# Patient Record
Sex: Male | Born: 1980 | Race: Black or African American | Hispanic: No | Marital: Single | Smoking: Current every day smoker
Health system: Southern US, Community
[De-identification: ages and names within clinical notes are randomized; demographics above are authoritative.]

---

## 2016-12-29 ENCOUNTER — Encounter: Payer: Self-pay | Admitting: Emergency Medicine

## 2016-12-29 ENCOUNTER — Emergency Department
Admission: EM | Admit: 2016-12-29 | Discharge: 2016-12-29 | Disposition: A | Discharge status: Home / Self Care | Payer: Self-pay | Attending: Emergency Medicine | Admitting: Emergency Medicine

## 2016-12-29 DIAGNOSIS — R030 Elevated blood-pressure reading, without diagnosis of hypertension: Secondary | ICD-10-CM

## 2016-12-29 DIAGNOSIS — J029 Acute pharyngitis, unspecified: Secondary | ICD-10-CM | POA: Insufficient documentation

## 2016-12-29 LAB — BASIC METABOLIC PANEL
ANION GAP: 10 (ref 5–15)
BUN: 8 mg/dL (ref 6–20)
CO2: 26 mmol/L (ref 22–32)
CREATININE: 0.97 mg/dL (ref 0.61–1.24)
Calcium: 9.1 mg/dL (ref 8.9–10.3)
Chloride: 104 mmol/L (ref 101–111)
GLUCOSE: 96 mg/dL (ref 65–99)
Potassium: 3.8 mmol/L (ref 3.5–5.1)
Sodium: 140 mmol/L (ref 135–145)

## 2016-12-29 LAB — CBC WITH DIFFERENTIAL/PLATELET
BASOS ABS: 0.1 10*3/uL (ref 0–0.1)
BASOS PCT: 1 %
EOS PCT: 1 %
Eosinophils Absolute: 0.1 10*3/uL (ref 0–0.7)
HCT: 39.7 % — ABNORMAL LOW (ref 40.0–52.0)
Hemoglobin: 13 g/dL (ref 13.0–18.0)
Lymphocytes Relative: 20 %
Lymphs Abs: 1.4 10*3/uL (ref 1.0–3.6)
MCH: 28.7 pg (ref 26.0–34.0)
MCHC: 32.8 g/dL (ref 32.0–36.0)
MCV: 87.3 fL (ref 80.0–100.0)
MONO ABS: 0.7 10*3/uL (ref 0.2–1.0)
Monocytes Relative: 10 %
NEUTROS ABS: 4.5 10*3/uL (ref 1.4–6.5)
Neutrophils Relative %: 68 %
PLATELETS: 213 10*3/uL (ref 150–440)
RBC: 4.55 MIL/uL (ref 4.40–5.90)
RDW: 13 % (ref 11.5–14.5)
WBC: 6.7 10*3/uL (ref 3.8–10.6)

## 2016-12-29 LAB — POCT RAPID STREP A: Streptococcus, Group A Screen (Direct): NEGATIVE

## 2016-12-29 MED ORDER — PREDNISONE 10 MG PO TABS: ORAL_TABLET | ORAL | 0 refills | Status: DC

## 2016-12-29 MED ORDER — HYDROCODONE-ACETAMINOPHEN 5-325 MG PO TABS
1.0000 | ORAL_TABLET | Freq: Once | ORAL | Status: AC
  Administered 2016-12-29: 1 via ORAL
  Filled 2016-12-29: qty 1

## 2016-12-29 MED ORDER — SODIUM CHLORIDE 0.9 % IV SOLN
3.0000 g | Freq: Once | INTRAVENOUS | Status: AC
  Administered 2016-12-29: 3 g via INTRAVENOUS
  Filled 2016-12-29: qty 3

## 2016-12-29 MED ORDER — AMOXICILLIN-POT CLAVULANATE 875-125 MG PO TABS: 1.0000 | ORAL_TABLET | Freq: Two times a day (BID) | ORAL | 0 refills | Status: AC

## 2016-12-29 MED ORDER — DEXAMETHASONE SODIUM PHOSPHATE 10 MG/ML IJ SOLN
10.0000 mg | Freq: Once | INTRAMUSCULAR | Status: AC
Start: 2016-12-29 — End: 2016-12-29
  Administered 2016-12-29: 10 mg via INTRAVENOUS
  Filled 2016-12-29: qty 1

## 2016-12-29 MED ORDER — HYDROCODONE-ACETAMINOPHEN 5-325 MG PO TABS: 1.0000 | ORAL_TABLET | Freq: Four times a day (QID) | ORAL | 0 refills | Status: AC | PRN

## 2016-12-29 NOTE — ED Provider Notes (Signed)
[facility] Emergency Department Provider Note   ____________________________________________   First MD Initiated Contact with Patient 12/29/16 [phone]     (approximate)  I have reviewed the triage vital signs and the nursing notes.   HISTORY  Chief Complaint Sore Throat and Otalgia   HPI Douglas Delorse LimberLegette Jr. is a 36 y.o. male is here with complaint of right ear pain for 3 days and this morning throat pain. Patient states that the right side is throat feels like it is swollen. He is unaware of any fever or chills.He has been continuing to eat and drink as normal. He states that pain increases with swallowing. He denies difficulty swallowing secretions.currently he rates his pain as 10 over 10.  History reviewed. No pertinent past medical history.  There are no active problems to display for this patient.   History reviewed. No pertinent surgical history.  Prior to Admission medications   Medication Sig Start Date End Date Taking? Authorizing Provider  amoxicillin-clavulanate (AUGMENTIN) 875-125 MG tablet Take 1 tablet by mouth 2 (two) times daily. 12/29/16 01/05/17  Douglas Crane, Douglas Duley Crane, Douglas Crane  HYDROcodone-acetaminophen (NORCO/VICODIN) 5-325 MG tablet Take 1 tablet by mouth every 6 (six) hours as needed for moderate pain. 12/29/16   Douglas Crane, Douglas Nau Crane, Douglas Crane  predniSONE (DELTASONE) 10 MG tablet Take 6 tablets  today, on day 2 take 5 tablets, day 3 take 4 tablets, day 4 take 3 tablets, day 5 take  2 tablets and 1 tablet the last day 12/29/16   Douglas Crane, Douglas Voytko Crane, Douglas Crane    Allergies Patient has no known allergies.  No family history on file.  Social History Social History  Substance Use Topics  . Smoking status: Not on file  . Smokeless tobacco: Not on file  . Alcohol use Not on file    Review of Systems Constitutional: No fever/chills Eyes: No visual changes. ENT: positive for sore throat. Cardiovascular: Denies chest pain. Respiratory: Denies  shortness of breath. Gastrointestinal:   No nausea, no vomiting.   Skin: Negative for rash. Neurological: Negative for headaches, focal weakness or numbness. ___________________________________________   PHYSICAL EXAM:  VITAL SIGNS: ED Triage Vitals [12/29/16 0840]  Enc Vitals Group     BP (!) 157/110     Pulse Rate 84     Resp 18     Temp 98.7 F (37.1 C)     Temp Source Oral     SpO2 99 %     Weight 180 lb (81.6 kg)     Height 6' (1.829 m)     Head Circumference      Peak Flow      Pain Score 10     Pain Loc      Pain Edu?      Excl. in GC?    Constitutional: Alert and oriented. Well appearing and in no acute distress. Eyes: Conjunctivae are normal.  Head: Atraumatic. Nose: No congestion/rhinnorhea.  EACs are obstructed with cerumen bilaterally. Mouth/Throat: Mucous membranes are moist.  Oropharynx erythematous without exudate. Right tonsil is mildly edematous but uvula is midline and patient continues to maintain secretions without any difficulty. Neck: No stridor.   Hematological/Lymphatic/Immunilogical: positive for right cervical lymphadenopathy. Cardiovascular: Normal rate, regular rhythm. Grossly normal heart sounds.  Good peripheral circulation. Respiratory: Normal respiratory effort.  No retractions. Lungs CTAB. Musculoskeletal: moves upper and lower extremities without any difficulty and normal gait was noted. Neurologic:  Normal speech and language. No gross focal neurologic deficits are appreciated.  Skin:  Skin  is warm, dry and intact. No rash noted. Psychiatric: Mood and affect are normal. Speech and behavior are normal.  ____________________________________________   LABS (all labs ordered are listed, but only abnormal results are displayed)  Labs Reviewed  CBC WITH DIFFERENTIAL/PLATELET - Abnormal; Notable for the following:       Result Value   HCT 39.7 (*)    All other components within normal limits  CULTURE, GROUP A STREP [facility])  BASIC  METABOLIC PANEL  POCT RAPID STREP A     PROCEDURES  Procedure(s) performed: None  Procedures  Critical Care performed: No  ____________________________________________   INITIAL IMPRESSION / ASSESSMENT AND PLAN / ED COURSE Patient was given Unasyn 3grams IV and Decadron 10 mg IV while in the department. Lab work was reassuring and patient improved. He is able to swallow Norco for pain while in the department without any difficulty. Patient is to follow-up with Dr. Jenne Campus who is on-call for ENT. He was given a prescription for Augmentin 875 twice a day for 10 days, norco every 6 hours as needed for pain and a tapering dose of prednisone starting with 60 mg. He was made aware that he should return to the emergency room if his symptoms worsen. He is encouraged to drink lots of fluids.  ____________________________________________   FINAL CLINICAL IMPRESSION(S) / ED DIAGNOSES  Final diagnoses:  Acute pharyngitis, unspecified etiology  Elevated blood pressure reading      NEW MEDICATIONS STARTED DURING THIS VISIT:  Discharge Medication List as of 12/29/2016 11:45 AM    START taking these medications   Details  amoxicillin-clavulanate (AUGMENTIN) 875-125 MG tablet Take 1 tablet by mouth 2 (two) times daily., Starting Sun 12/29/2016, Until Sun 01/05/2017, Print    HYDROcodone-acetaminophen (NORCO/VICODIN) 5-325 MG tablet Take 1 tablet by mouth every 6 (six) hours as needed for moderate pain., Starting Sun 12/29/2016, Print    predniSONE (DELTASONE) 10 MG tablet Take 6 tablets  today, on day 2 take 5 tablets, day 3 take 4 tablets, day 4 take 3 tablets, day 5 take  2 tablets and 1 tablet the last day, Print         Note:  This document was prepared using Dragon voice recognition software and may include unintentional dictation errors.    Douglas Rumps, Douglas Crane 12/29/16 1520    Governor Rooks, MD 01/03/17 [phone]

## 2016-12-29 NOTE — ED Triage Notes (Signed)
Patient presents to the ED with right ear pain x 3 days.  Patient states when he swallows he notices his ear hurts worse and his throat hurts some.  Patient is speaking in full sentences without difficulty and maintaining secretions.  Patient reports he feels the right side of his throat may be swollen.  Patient denies fever or cough or chills.  Patient states, "I've been able to work through it, but this morning, it just hurt too bad."

## 2016-12-29 NOTE — ED Notes (Signed)
First Nurse Note: Pt c/o right ear pain and Sore throat. Pt in NAD at this time. Respirations equal and unlabored.

## 2016-12-29 NOTE — Discharge Instructions (Signed)
Begin taking antibiotics this evening as you've had your first dose in the emergency department. Continue taking Norco as needed for pain. Also began taking prednisone tablets beginning with 6 tablets today and tapering down to 1. Increase fluids. Make an appointment with [facility] and contact information is listed on your discharge papers for Dr. Jenne CampusMcQueen who is on call. Return to the emergency department if there is severe worsening of your symptoms. Also have your blood pressure rechecked as it was elevated today in the emergency department. Blood pressure was 157/110.

## 2017-01-01 LAB — CULTURE, GROUP A STREP (THRC)

## 2017-06-30 ENCOUNTER — Other Ambulatory Visit: Payer: Self-pay

## 2017-06-30 ENCOUNTER — Encounter (HOSPITAL_COMMUNITY): Payer: Self-pay

## 2017-06-30 ENCOUNTER — Emergency Department (HOSPITAL_COMMUNITY): Payer: Self-pay

## 2017-06-30 ENCOUNTER — Emergency Department (HOSPITAL_COMMUNITY)
Admission: EM | Admit: 2017-06-30 | Discharge: 2017-06-30 | Disposition: A | Discharge status: Home / Self Care | Payer: Self-pay | Attending: Emergency Medicine | Admitting: Emergency Medicine

## 2017-06-30 DIAGNOSIS — Y939 Activity, unspecified: Secondary | ICD-10-CM | POA: Insufficient documentation

## 2017-06-30 DIAGNOSIS — F17210 Nicotine dependence, cigarettes, uncomplicated: Secondary | ICD-10-CM | POA: Insufficient documentation

## 2017-06-30 DIAGNOSIS — S0121XA Laceration without foreign body of nose, initial encounter: Secondary | ICD-10-CM | POA: Insufficient documentation

## 2017-06-30 DIAGNOSIS — S0083XA Contusion of other part of head, initial encounter: Secondary | ICD-10-CM | POA: Insufficient documentation

## 2017-06-30 DIAGNOSIS — Z23 Encounter for immunization: Secondary | ICD-10-CM | POA: Insufficient documentation

## 2017-06-30 DIAGNOSIS — Y999 Unspecified external cause status: Secondary | ICD-10-CM | POA: Insufficient documentation

## 2017-06-30 DIAGNOSIS — R51 Headache: Secondary | ICD-10-CM | POA: Insufficient documentation

## 2017-06-30 DIAGNOSIS — Y929 Unspecified place or not applicable: Secondary | ICD-10-CM | POA: Insufficient documentation

## 2017-06-30 LAB — BASIC METABOLIC PANEL
ANION GAP: 14 (ref 5–15)
BUN: 6 mg/dL (ref 6–20)
CALCIUM: 9.1 mg/dL (ref 8.9–10.3)
CO2: 20 mmol/L — AB (ref 22–32)
Chloride: 104 mmol/L (ref 101–111)
Creatinine, Ser: 1.04 mg/dL (ref 0.61–1.24)
GFR calc Af Amer: >60 mL/min (ref >60)
GFR calc non Af Amer: >60 mL/min (ref >60)
GLUCOSE: 73 mg/dL (ref 65–99)
Potassium: 4 mmol/L (ref 3.5–5.1)
Sodium: 138 mmol/L (ref 135–145)

## 2017-06-30 LAB — CBC
HEMATOCRIT: 39.5 % (ref 39.0–52.0)
Hemoglobin: 12.8 g/dL — ABNORMAL LOW (ref 13.0–17.0)
MCH: 28.8 pg (ref 26.0–34.0)
MCHC: 32.4 g/dL (ref 30.0–36.0)
MCV: 89 fL (ref 78.0–100.0)
Platelets: 230 10*3/uL (ref 150–400)
RBC: 4.44 MIL/uL (ref 4.22–5.81)
RDW: 13.2 % (ref 11.5–15.5)
WBC: 15.4 10*3/uL — ABNORMAL HIGH (ref 4.0–10.5)

## 2017-06-30 MED ORDER — TETANUS-DIPHTH-ACELL PERTUSSIS 5-2.5-18.5 LF-MCG/0.5 IM SUSP
0.5000 mL | Freq: Once | INTRAMUSCULAR | Status: AC
  Administered 2017-06-30: 0.5 mL via INTRAMUSCULAR
  Filled 2017-06-30: qty 0.5

## 2017-06-30 MED ORDER — LIDOCAINE HCL (PF) 1 % IJ SOLN
5.0000 mL | Freq: Once | INTRAMUSCULAR | Status: AC
Start: 2017-06-30 — End: 2017-06-30
  Administered 2017-06-30: 5 mL via INTRADERMAL
  Filled 2017-06-30: qty 5

## 2017-06-30 MED ORDER — FENTANYL CITRATE (PF) 100 MCG/2ML IJ SOLN
50.0000 ug | Freq: Once | INTRAMUSCULAR | Status: AC
  Administered 2017-06-30: 50 ug via INTRAVENOUS
  Filled 2017-06-30: qty 2

## 2017-06-30 MED ORDER — IOPAMIDOL (ISOVUE-370) INJECTION 76%: 50.0000 mL | Freq: Once | INTRAVENOUS | Status: DC | PRN

## 2017-06-30 MED ORDER — OXYMETAZOLINE HCL 0.05 % NA SOLN
1.0000 | Freq: Two times a day (BID) | NASAL | Status: DC
  Administered 2017-06-30: 1 via NASAL
  Filled 2017-06-30: qty 15

## 2017-06-30 MED ORDER — OXYCODONE HCL 5 MG PO TABS
5.0000 mg | ORAL_TABLET | Freq: Once | ORAL | Status: AC
  Administered 2017-06-30: 5 mg via ORAL
  Filled 2017-06-30: qty 1

## 2017-06-30 MED ORDER — IOPAMIDOL (ISOVUE-370) INJECTION 76%
50.0000 mL | Freq: Once | INTRAVENOUS | Status: AC | PRN
  Administered 2017-06-30: 50 mL via INTRAVENOUS

## 2017-06-30 MED ORDER — IOPAMIDOL (ISOVUE-370) INJECTION 76%
INTRAVENOUS | Status: AC
  Filled 2017-06-30: qty 50
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
## 2017-06-30 NOTE — ED Notes (Signed)
Spoke with Aurther Lofterry from lab about status of labs.

## 2017-06-30 NOTE — ED Notes (Signed)
Patient transported to CT 
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
